# Patient Record
Sex: Female | Born: 1975
Health system: Southern US, Community
[De-identification: ages and names within clinical notes are randomized; demographics above are authoritative.]

## PROBLEM LIST (undated history)

## (undated) DIAGNOSIS — C801 Malignant (primary) neoplasm, unspecified: Secondary | ICD-10-CM

## (undated) HISTORY — PX: LEEP: SHX91

---

## 1998-01-01 ENCOUNTER — Emergency Department (HOSPITAL_COMMUNITY): Admission: EM | Admit: 1998-01-01 | Discharge: 1998-01-02 | Payer: Self-pay | Admitting: Emergency Medicine

## 1998-01-05 ENCOUNTER — Emergency Department (HOSPITAL_COMMUNITY): Admission: EM | Admit: 1998-01-05 | Discharge: 1998-01-05 | Payer: Self-pay

## 1998-03-18 ENCOUNTER — Inpatient Hospital Stay (HOSPITAL_COMMUNITY): Admission: AD | Admit: 1998-03-18 | Discharge: 1998-03-18 | Payer: Self-pay | Admitting: Obstetrics and Gynecology

## 1998-03-18 ENCOUNTER — Encounter: Payer: Self-pay | Admitting: Emergency Medicine

## 1998-05-18 ENCOUNTER — Emergency Department (HOSPITAL_COMMUNITY): Admission: EM | Admit: 1998-05-18 | Discharge: 1998-05-18 | Payer: Self-pay | Admitting: Emergency Medicine

## 1998-05-18 ENCOUNTER — Encounter: Payer: Self-pay | Admitting: Emergency Medicine

## 1998-09-27 ENCOUNTER — Emergency Department (HOSPITAL_COMMUNITY): Admission: EM | Admit: 1998-09-27 | Discharge: 1998-09-27 | Payer: Self-pay

## 1998-10-05 ENCOUNTER — Inpatient Hospital Stay (HOSPITAL_COMMUNITY): Admission: AD | Admit: 1998-10-05 | Discharge: 1998-10-05 | Payer: Self-pay | Admitting: *Deleted

## 1998-10-05 ENCOUNTER — Encounter: Payer: Self-pay | Admitting: *Deleted

## 1998-11-11 ENCOUNTER — Encounter: Payer: Self-pay | Admitting: Obstetrics and Gynecology

## 1998-11-11 ENCOUNTER — Ambulatory Visit (HOSPITAL_COMMUNITY): Admission: AD | Admit: 1998-11-11 | Discharge: 1998-11-11 | Payer: Self-pay | Admitting: Obstetrics and Gynecology

## 1998-11-16 ENCOUNTER — Inpatient Hospital Stay (HOSPITAL_COMMUNITY): Admission: AD | Admit: 1998-11-16 | Discharge: 1998-11-16 | Payer: Self-pay | Admitting: Obstetrics & Gynecology

## 1999-03-25 ENCOUNTER — Inpatient Hospital Stay (HOSPITAL_COMMUNITY): Admission: AD | Admit: 1999-03-25 | Discharge: 1999-03-25 | Payer: Self-pay | Admitting: Obstetrics and Gynecology

## 1999-03-25 ENCOUNTER — Encounter: Payer: Self-pay | Admitting: *Deleted

## 1999-04-07 ENCOUNTER — Inpatient Hospital Stay (HOSPITAL_COMMUNITY): Admission: AD | Admit: 1999-04-07 | Discharge: 1999-04-07 | Payer: Self-pay | Admitting: Obstetrics & Gynecology

## 1999-04-07 ENCOUNTER — Encounter: Payer: Self-pay | Admitting: Obstetrics & Gynecology

## 1999-04-21 ENCOUNTER — Inpatient Hospital Stay (HOSPITAL_COMMUNITY): Admission: AD | Admit: 1999-04-21 | Discharge: 1999-04-21 | Payer: Self-pay | Admitting: *Deleted

## 1999-07-11 ENCOUNTER — Encounter: Payer: Self-pay | Admitting: Obstetrics & Gynecology

## 1999-07-11 ENCOUNTER — Inpatient Hospital Stay (HOSPITAL_COMMUNITY): Admission: AD | Admit: 1999-07-11 | Discharge: 1999-07-11 | Payer: Self-pay | Admitting: Obstetrics & Gynecology

## 1999-07-14 ENCOUNTER — Inpatient Hospital Stay (HOSPITAL_COMMUNITY): Admission: AD | Admit: 1999-07-14 | Discharge: 1999-07-14 | Payer: Self-pay | Admitting: Obstetrics & Gynecology

## 1999-07-19 ENCOUNTER — Inpatient Hospital Stay (HOSPITAL_COMMUNITY): Admission: AD | Admit: 1999-07-19 | Discharge: 1999-07-19 | Payer: Self-pay | Admitting: Obstetrics

## 1999-11-14 ENCOUNTER — Inpatient Hospital Stay (HOSPITAL_COMMUNITY): Admission: AD | Admit: 1999-11-14 | Discharge: 1999-11-18 | Payer: Self-pay | Admitting: Obstetrics and Gynecology

## 2000-01-06 ENCOUNTER — Other Ambulatory Visit: Admission: RE | Admit: 2000-01-06 | Discharge: 2000-01-06 | Payer: Self-pay | Admitting: Obstetrics and Gynecology

## 2000-01-26 ENCOUNTER — Other Ambulatory Visit: Admission: RE | Admit: 2000-01-26 | Discharge: 2000-01-26 | Payer: Self-pay | Admitting: Obstetrics and Gynecology

## 2000-02-10 ENCOUNTER — Ambulatory Visit (HOSPITAL_COMMUNITY): Admission: RE | Admit: 2000-02-10 | Discharge: 2000-02-10 | Payer: Self-pay | Admitting: Obstetrics and Gynecology

## 2001-08-19 ENCOUNTER — Other Ambulatory Visit: Admission: RE | Admit: 2001-08-19 | Discharge: 2001-08-19 | Payer: Self-pay | Admitting: *Deleted

## 2001-12-13 ENCOUNTER — Other Ambulatory Visit: Admission: RE | Admit: 2001-12-13 | Discharge: 2001-12-13 | Payer: Self-pay | Admitting: Obstetrics and Gynecology

## 2002-12-31 ENCOUNTER — Other Ambulatory Visit: Admission: RE | Admit: 2002-12-31 | Discharge: 2002-12-31 | Payer: Self-pay | Admitting: Obstetrics and Gynecology

## 2003-09-12 ENCOUNTER — Emergency Department (HOSPITAL_COMMUNITY): Admission: EM | Admit: 2003-09-12 | Discharge: 2003-09-13 | Payer: Self-pay | Admitting: Emergency Medicine

## 2004-01-12 ENCOUNTER — Other Ambulatory Visit: Admission: RE | Admit: 2004-01-12 | Discharge: 2004-01-12 | Payer: Self-pay | Admitting: Obstetrics and Gynecology

## 2006-04-18 ENCOUNTER — Inpatient Hospital Stay (HOSPITAL_COMMUNITY): Admission: RE | Admit: 2006-04-18 | Discharge: 2006-04-20 | Payer: Self-pay | Admitting: Obstetrics and Gynecology

## 2006-06-15 ENCOUNTER — Ambulatory Visit (HOSPITAL_COMMUNITY): Admission: RE | Admit: 2006-06-15 | Discharge: 2006-06-15 | Payer: Self-pay | Admitting: Obstetrics and Gynecology

## 2011-11-30 ENCOUNTER — Other Ambulatory Visit: Payer: Self-pay | Admitting: Family Medicine

## 2011-11-30 DIAGNOSIS — M5412 Radiculopathy, cervical region: Secondary | ICD-10-CM

## 2011-12-01 ENCOUNTER — Ambulatory Visit
Admission: RE | Admit: 2011-12-01 | Discharge: 2011-12-01 | Disposition: A | Discharge status: Home / Self Care | Payer: BC Managed Care – PPO | Source: Ambulatory Visit | Attending: Family Medicine | Admitting: Family Medicine

## 2011-12-01 DIAGNOSIS — M5412 Radiculopathy, cervical region: Secondary | ICD-10-CM

## 2016-08-08 DIAGNOSIS — M9901 Segmental and somatic dysfunction of cervical region: Secondary | ICD-10-CM | POA: Diagnosis not present

## 2016-08-08 DIAGNOSIS — M531 Cervicobrachial syndrome: Secondary | ICD-10-CM | POA: Diagnosis not present

## 2016-08-08 DIAGNOSIS — M9902 Segmental and somatic dysfunction of thoracic region: Secondary | ICD-10-CM | POA: Diagnosis not present

## 2016-08-08 DIAGNOSIS — M9903 Segmental and somatic dysfunction of lumbar region: Secondary | ICD-10-CM | POA: Diagnosis not present

## 2016-08-09 DIAGNOSIS — M9903 Segmental and somatic dysfunction of lumbar region: Secondary | ICD-10-CM | POA: Diagnosis not present

## 2016-08-09 DIAGNOSIS — M9901 Segmental and somatic dysfunction of cervical region: Secondary | ICD-10-CM | POA: Diagnosis not present

## 2016-08-09 DIAGNOSIS — M9902 Segmental and somatic dysfunction of thoracic region: Secondary | ICD-10-CM | POA: Diagnosis not present

## 2016-08-09 DIAGNOSIS — M531 Cervicobrachial syndrome: Secondary | ICD-10-CM | POA: Diagnosis not present

## 2016-08-10 DIAGNOSIS — M9903 Segmental and somatic dysfunction of lumbar region: Secondary | ICD-10-CM | POA: Diagnosis not present

## 2016-08-10 DIAGNOSIS — M9902 Segmental and somatic dysfunction of thoracic region: Secondary | ICD-10-CM | POA: Diagnosis not present

## 2016-08-10 DIAGNOSIS — M531 Cervicobrachial syndrome: Secondary | ICD-10-CM | POA: Diagnosis not present

## 2016-08-10 DIAGNOSIS — M9901 Segmental and somatic dysfunction of cervical region: Secondary | ICD-10-CM | POA: Diagnosis not present

## 2016-08-12 DIAGNOSIS — M9902 Segmental and somatic dysfunction of thoracic region: Secondary | ICD-10-CM | POA: Diagnosis not present

## 2016-08-12 DIAGNOSIS — M9901 Segmental and somatic dysfunction of cervical region: Secondary | ICD-10-CM | POA: Diagnosis not present

## 2016-08-12 DIAGNOSIS — M531 Cervicobrachial syndrome: Secondary | ICD-10-CM | POA: Diagnosis not present

## 2016-08-12 DIAGNOSIS — M9903 Segmental and somatic dysfunction of lumbar region: Secondary | ICD-10-CM | POA: Diagnosis not present

## 2016-08-14 DIAGNOSIS — M531 Cervicobrachial syndrome: Secondary | ICD-10-CM | POA: Diagnosis not present

## 2016-08-14 DIAGNOSIS — M9902 Segmental and somatic dysfunction of thoracic region: Secondary | ICD-10-CM | POA: Diagnosis not present

## 2016-08-14 DIAGNOSIS — M9901 Segmental and somatic dysfunction of cervical region: Secondary | ICD-10-CM | POA: Diagnosis not present

## 2016-08-14 DIAGNOSIS — M9903 Segmental and somatic dysfunction of lumbar region: Secondary | ICD-10-CM | POA: Diagnosis not present

## 2016-08-16 DIAGNOSIS — M9901 Segmental and somatic dysfunction of cervical region: Secondary | ICD-10-CM | POA: Diagnosis not present

## 2016-08-16 DIAGNOSIS — M9902 Segmental and somatic dysfunction of thoracic region: Secondary | ICD-10-CM | POA: Diagnosis not present

## 2016-08-16 DIAGNOSIS — M531 Cervicobrachial syndrome: Secondary | ICD-10-CM | POA: Diagnosis not present

## 2016-08-16 DIAGNOSIS — M9903 Segmental and somatic dysfunction of lumbar region: Secondary | ICD-10-CM | POA: Diagnosis not present

## 2016-08-19 DIAGNOSIS — M9903 Segmental and somatic dysfunction of lumbar region: Secondary | ICD-10-CM | POA: Diagnosis not present

## 2016-08-19 DIAGNOSIS — M9901 Segmental and somatic dysfunction of cervical region: Secondary | ICD-10-CM | POA: Diagnosis not present

## 2016-08-19 DIAGNOSIS — M531 Cervicobrachial syndrome: Secondary | ICD-10-CM | POA: Diagnosis not present

## 2016-08-19 DIAGNOSIS — M9902 Segmental and somatic dysfunction of thoracic region: Secondary | ICD-10-CM | POA: Diagnosis not present

## 2016-08-21 DIAGNOSIS — M9903 Segmental and somatic dysfunction of lumbar region: Secondary | ICD-10-CM | POA: Diagnosis not present

## 2016-08-21 DIAGNOSIS — M531 Cervicobrachial syndrome: Secondary | ICD-10-CM | POA: Diagnosis not present

## 2016-08-21 DIAGNOSIS — M9901 Segmental and somatic dysfunction of cervical region: Secondary | ICD-10-CM | POA: Diagnosis not present

## 2016-08-21 DIAGNOSIS — M9902 Segmental and somatic dysfunction of thoracic region: Secondary | ICD-10-CM | POA: Diagnosis not present

## 2016-08-23 DIAGNOSIS — M9901 Segmental and somatic dysfunction of cervical region: Secondary | ICD-10-CM | POA: Diagnosis not present

## 2016-08-23 DIAGNOSIS — M531 Cervicobrachial syndrome: Secondary | ICD-10-CM | POA: Diagnosis not present

## 2016-08-23 DIAGNOSIS — M9902 Segmental and somatic dysfunction of thoracic region: Secondary | ICD-10-CM | POA: Diagnosis not present

## 2016-08-23 DIAGNOSIS — M9903 Segmental and somatic dysfunction of lumbar region: Secondary | ICD-10-CM | POA: Diagnosis not present

## 2016-08-25 DIAGNOSIS — M9903 Segmental and somatic dysfunction of lumbar region: Secondary | ICD-10-CM | POA: Diagnosis not present

## 2016-08-25 DIAGNOSIS — M9901 Segmental and somatic dysfunction of cervical region: Secondary | ICD-10-CM | POA: Diagnosis not present

## 2016-08-25 DIAGNOSIS — M9902 Segmental and somatic dysfunction of thoracic region: Secondary | ICD-10-CM | POA: Diagnosis not present

## 2016-08-25 DIAGNOSIS — M531 Cervicobrachial syndrome: Secondary | ICD-10-CM | POA: Diagnosis not present

## 2016-08-30 DIAGNOSIS — M531 Cervicobrachial syndrome: Secondary | ICD-10-CM | POA: Diagnosis not present

## 2016-08-30 DIAGNOSIS — M9903 Segmental and somatic dysfunction of lumbar region: Secondary | ICD-10-CM | POA: Diagnosis not present

## 2016-08-30 DIAGNOSIS — M9901 Segmental and somatic dysfunction of cervical region: Secondary | ICD-10-CM | POA: Diagnosis not present

## 2016-08-30 DIAGNOSIS — M9902 Segmental and somatic dysfunction of thoracic region: Secondary | ICD-10-CM | POA: Diagnosis not present

## 2016-09-06 DIAGNOSIS — M9903 Segmental and somatic dysfunction of lumbar region: Secondary | ICD-10-CM | POA: Diagnosis not present

## 2016-09-06 DIAGNOSIS — M9901 Segmental and somatic dysfunction of cervical region: Secondary | ICD-10-CM | POA: Diagnosis not present

## 2016-09-06 DIAGNOSIS — M531 Cervicobrachial syndrome: Secondary | ICD-10-CM | POA: Diagnosis not present

## 2016-09-06 DIAGNOSIS — M9902 Segmental and somatic dysfunction of thoracic region: Secondary | ICD-10-CM | POA: Diagnosis not present

## 2016-09-08 DIAGNOSIS — M9901 Segmental and somatic dysfunction of cervical region: Secondary | ICD-10-CM | POA: Diagnosis not present

## 2016-09-08 DIAGNOSIS — M9902 Segmental and somatic dysfunction of thoracic region: Secondary | ICD-10-CM | POA: Diagnosis not present

## 2016-09-08 DIAGNOSIS — M9903 Segmental and somatic dysfunction of lumbar region: Secondary | ICD-10-CM | POA: Diagnosis not present

## 2016-09-08 DIAGNOSIS — M531 Cervicobrachial syndrome: Secondary | ICD-10-CM | POA: Diagnosis not present

## 2016-09-13 DIAGNOSIS — M9903 Segmental and somatic dysfunction of lumbar region: Secondary | ICD-10-CM | POA: Diagnosis not present

## 2016-09-13 DIAGNOSIS — M9901 Segmental and somatic dysfunction of cervical region: Secondary | ICD-10-CM | POA: Diagnosis not present

## 2016-09-13 DIAGNOSIS — M9902 Segmental and somatic dysfunction of thoracic region: Secondary | ICD-10-CM | POA: Diagnosis not present

## 2016-09-13 DIAGNOSIS — M531 Cervicobrachial syndrome: Secondary | ICD-10-CM | POA: Diagnosis not present

## 2017-02-26 DIAGNOSIS — R300 Dysuria: Secondary | ICD-10-CM | POA: Diagnosis not present

## 2017-02-26 DIAGNOSIS — N3001 Acute cystitis with hematuria: Secondary | ICD-10-CM | POA: Diagnosis not present

## 2017-07-26 DIAGNOSIS — Z13220 Encounter for screening for lipoid disorders: Secondary | ICD-10-CM | POA: Diagnosis not present

## 2017-07-26 DIAGNOSIS — Z0000 Encounter for general adult medical examination without abnormal findings: Secondary | ICD-10-CM | POA: Diagnosis not present

## 2017-07-26 DIAGNOSIS — Z131 Encounter for screening for diabetes mellitus: Secondary | ICD-10-CM | POA: Diagnosis not present

## 2018-02-27 DIAGNOSIS — N939 Abnormal uterine and vaginal bleeding, unspecified: Secondary | ICD-10-CM | POA: Diagnosis not present

## 2018-03-01 DIAGNOSIS — N939 Abnormal uterine and vaginal bleeding, unspecified: Secondary | ICD-10-CM | POA: Diagnosis not present

## 2018-03-14 ENCOUNTER — Emergency Department (HOSPITAL_BASED_OUTPATIENT_CLINIC_OR_DEPARTMENT_OTHER)
Admission: EM | Admit: 2018-03-14 | Discharge: 2018-03-14 | Disposition: A | Discharge status: Home / Self Care | Payer: No Typology Code available for payment source | Attending: Emergency Medicine | Admitting: Emergency Medicine

## 2018-03-14 ENCOUNTER — Other Ambulatory Visit: Payer: Self-pay

## 2018-03-14 ENCOUNTER — Encounter (HOSPITAL_BASED_OUTPATIENT_CLINIC_OR_DEPARTMENT_OTHER): Payer: Self-pay | Admitting: Emergency Medicine

## 2018-03-14 ENCOUNTER — Emergency Department (HOSPITAL_BASED_OUTPATIENT_CLINIC_OR_DEPARTMENT_OTHER): Payer: No Typology Code available for payment source

## 2018-03-14 DIAGNOSIS — S161XXA Strain of muscle, fascia and tendon at neck level, initial encounter: Secondary | ICD-10-CM

## 2018-03-14 DIAGNOSIS — S199XXA Unspecified injury of neck, initial encounter: Secondary | ICD-10-CM | POA: Diagnosis present

## 2018-03-14 DIAGNOSIS — Y92410 Unspecified street and highway as the place of occurrence of the external cause: Secondary | ICD-10-CM | POA: Insufficient documentation

## 2018-03-14 DIAGNOSIS — Y9389 Activity, other specified: Secondary | ICD-10-CM | POA: Diagnosis not present

## 2018-03-14 DIAGNOSIS — Y999 Unspecified external cause status: Secondary | ICD-10-CM | POA: Insufficient documentation

## 2018-03-14 DIAGNOSIS — Z859 Personal history of malignant neoplasm, unspecified: Secondary | ICD-10-CM | POA: Diagnosis not present

## 2018-03-14 DIAGNOSIS — M549 Dorsalgia, unspecified: Secondary | ICD-10-CM | POA: Insufficient documentation

## 2018-03-14 HISTORY — DX: Malignant (primary) neoplasm, unspecified: C80.1

## 2018-03-14 MED ORDER — METHOCARBAMOL 500 MG PO TABS: 500.0000 mg | ORAL_TABLET | Freq: Three times a day (TID) | ORAL | 0 refills | Status: AC | PRN

## 2018-03-14 MED ORDER — IBUPROFEN 800 MG PO TABS: 800.0000 mg | ORAL_TABLET | Freq: Three times a day (TID) | ORAL | 0 refills | Status: AC | PRN

## 2018-03-14 MED ORDER — IBUPROFEN 800 MG PO TABS
800.0000 mg | ORAL_TABLET | Freq: Once | ORAL | Status: AC
  Administered 2018-03-14: 800 mg via ORAL
  Filled 2018-03-14: qty 1

## 2018-03-14 MED FILL — IBUPROFEN 800 MG TAB: 800 | 7 days supply | Qty: 21 | Fill #0

## 2018-03-14 MED FILL — METHOCARBAMOL 500 MG TABLET: 500 | 3 days supply | Qty: 20 | Fill #0

## 2018-03-14 NOTE — ED Notes (Signed)
Patient transported to X-ray 
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
## 2018-03-14 NOTE — ED Notes (Signed)
ED Provider at bedside. 
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
## 2019-03-31 DIAGNOSIS — Z20828 Contact with and (suspected) exposure to other viral communicable diseases: Secondary | ICD-10-CM | POA: Diagnosis not present
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
## 2019-06-11 ENCOUNTER — Ambulatory Visit: Payer: BC Managed Care – PPO | Attending: Internal Medicine

## 2019-06-11 DIAGNOSIS — Z20822 Contact with and (suspected) exposure to covid-19: Secondary | ICD-10-CM | POA: Diagnosis not present

## 2019-06-12 LAB — NOVEL CORONAVIRUS, NAA: SARS-CoV-2, NAA: NOT DETECTED

## 2020-02-02 DIAGNOSIS — Z03818 Encounter for observation for suspected exposure to other biological agents ruled out: Secondary | ICD-10-CM | POA: Diagnosis not present

## 2020-02-02 DIAGNOSIS — Z20822 Contact with and (suspected) exposure to covid-19: Secondary | ICD-10-CM | POA: Diagnosis not present

## 2021-10-14 ENCOUNTER — Other Ambulatory Visit: Payer: Self-pay | Admitting: Family Medicine

## 2021-10-14 DIAGNOSIS — N920 Excessive and frequent menstruation with regular cycle: Secondary | ICD-10-CM

## 2022-11-21 ENCOUNTER — Other Ambulatory Visit: Payer: Self-pay | Admitting: Family Medicine

## 2022-11-21 ENCOUNTER — Ambulatory Visit
Admission: RE | Admit: 2022-11-21 | Discharge: 2022-11-21 | Disposition: A | Discharge status: Home / Self Care | Payer: 59 | Source: Ambulatory Visit | Attending: Family Medicine | Admitting: Family Medicine

## 2022-11-21 DIAGNOSIS — M25561 Pain in right knee: Secondary | ICD-10-CM
